# Patient Record
Sex: Female | Born: 1980 | Race: White | Hispanic: No | Marital: Married | Smoking: Never smoker
Health system: Southern US, Community
[De-identification: ages and names within clinical notes are randomized; demographics above are authoritative.]

## PROBLEM LIST (undated history)

## (undated) DIAGNOSIS — O24419 Gestational diabetes mellitus in pregnancy, unspecified control: Secondary | ICD-10-CM

## (undated) DIAGNOSIS — R87629 Unspecified abnormal cytological findings in specimens from vagina: Secondary | ICD-10-CM

## (undated) DIAGNOSIS — Z789 Other specified health status: Secondary | ICD-10-CM

## (undated) HISTORY — DX: Unspecified abnormal cytological findings in specimens from vagina: R87.629

## (undated) HISTORY — PX: HERNIA REPAIR: SHX51

## (undated) HISTORY — DX: Gestational diabetes mellitus in pregnancy, unspecified control: O24.419

## (undated) HISTORY — PX: MYRINGOTOMY: SUR874

## (undated) HISTORY — PX: CERVICAL CONE BIOPSY: SUR198

---

## 2015-04-02 ENCOUNTER — Other Ambulatory Visit (HOSPITAL_COMMUNITY): Payer: Self-pay | Admitting: Obstetrics & Gynecology

## 2015-04-02 DIAGNOSIS — N979 Female infertility, unspecified: Secondary | ICD-10-CM

## 2015-04-09 ENCOUNTER — Ambulatory Visit (HOSPITAL_COMMUNITY)
Admission: RE | Admit: 2015-04-09 | Discharge: 2015-04-09 | Disposition: A | Discharge status: Home / Self Care | Payer: Managed Care, Other (non HMO) | Source: Ambulatory Visit | Attending: Obstetrics & Gynecology | Admitting: Obstetrics & Gynecology

## 2015-04-09 DIAGNOSIS — N979 Female infertility, unspecified: Secondary | ICD-10-CM

## 2015-04-09 MED ORDER — IOHEXOL 300 MG/ML  SOLN
30.0000 mL | Freq: Once | INTRAMUSCULAR | Status: AC | PRN
Start: 2015-04-09 — End: 2015-04-09
  Administered 2015-04-09: 30 mL

## 2016-01-21 NOTE — L&D Delivery Note (Signed)
Delivery Note  First Stage: Labor onset: 1200 Augmentation : none Analgesia /Anesthesia intrapartum: epidural in second stage SROM at 0600  Second Stage: Complete dilation at 2315 Onset of pushing at 0330 FHR second stage category 2 with variables  Delivery of a viable female at [id number] by CNM in ROA position no nuchal cord Noted double tight cord wrap around ankle Cord double clamped after cessation of pulsation, cut by FOB Cord blood sample collected   Third Stage: [facility]acent[facility] delivered [place] [facility] intact with 3 VC @ 0559 Placenta disposition: disposal Uterine tone firm / bleeding small  2nd degree perineal laceration identified  Anesthesia for repair: epidural Repair 3-0 vicryl rapide deep muscle repair / 4-0 vicryl subcuticular Est. Blood Loss (mL): 200  Complications: none  Mom to postpartum.  Baby to Couplet care / Skin to Skin.  Newborn: Birth Weight: 6-14 Apgar Scores: 9-9 Feeding planned: breast  Marlinda MikeBAILEY, TANYA CNM, MSN, [facility] 04/06/2016, 6:25 AM

## 2016-03-17 LAB — OB RESULTS CONSOLE GBS: GBS: NEGATIVE

## 2016-04-05 ENCOUNTER — Inpatient Hospital Stay (HOSPITAL_COMMUNITY)
Admission: AD | Admit: 2016-04-05 | Discharge: 2016-04-07 | DRG: 775 | Disposition: A | Discharge status: Home / Self Care | Payer: BLUE CROSS/BLUE SHIELD | Source: Ambulatory Visit

## 2016-04-05 ENCOUNTER — Encounter (HOSPITAL_COMMUNITY): Payer: Self-pay | Admitting: *Deleted

## 2016-04-05 DIAGNOSIS — Z3A39 39 weeks gestation of pregnancy: Secondary | ICD-10-CM

## 2016-04-05 DIAGNOSIS — Z3490 Encounter for supervision of normal pregnancy, unspecified, unspecified trimester: Secondary | ICD-10-CM

## 2016-04-05 DIAGNOSIS — O4202 Full-term premature rupture of membranes, onset of labor within 24 hours of rupture: Secondary | ICD-10-CM | POA: Diagnosis present

## 2016-04-05 HISTORY — DX: Other specified health status: Z78.9

## 2016-04-05 LAB — OB RESULTS CONSOLE HEPATITIS B SURFACE ANTIGEN: Hepatitis B Surface Ag: NEGATIVE

## 2016-04-05 LAB — CBC
HCT: 38.7 % (ref 36.0–46.0)
HEMOGLOBIN: 12.9 g/dL (ref 12.0–15.0)
MCH: 29.5 pg (ref 26.0–34.0)
MCHC: 33.3 g/dL (ref 30.0–36.0)
MCV: 88.4 fL (ref 78.0–100.0)
Platelets: 193 10*3/uL (ref 150–400)
RBC: 4.38 MIL/uL (ref 3.87–5.11)
RDW: 14.1 % (ref 11.5–15.5)
WBC: 12.6 10*3/uL — AB (ref 4.0–10.5)

## 2016-04-05 LAB — OB RESULTS CONSOLE HIV ANTIBODY (ROUTINE TESTING): HIV: NONREACTIVE

## 2016-04-05 LAB — OB RESULTS CONSOLE ABO/RH: RH Type: POSITIVE

## 2016-04-05 LAB — OB RESULTS CONSOLE GC/CHLAMYDIA
CHLAMYDIA, DNA PROBE: NEGATIVE
GC PROBE AMP, GENITAL: NEGATIVE

## 2016-04-05 LAB — TYPE AND SCREEN
ABO/RH(D): O POS
Antibody Screen: NEGATIVE

## 2016-04-05 LAB — ABO/RH: ABO/RH(D): O POS

## 2016-04-05 LAB — OB RESULTS CONSOLE RUBELLA ANTIBODY, IGM: RUBELLA: IMMUNE

## 2016-04-05 LAB — OB RESULTS CONSOLE RPR: RPR: NONREACTIVE

## 2016-04-05 LAB — OB RESULTS CONSOLE ANTIBODY SCREEN: ANTIBODY SCREEN: NEGATIVE

## 2016-04-05 MED ORDER — ONDANSETRON HCL 4 MG/2ML IJ SOLN: 4.0000 mg | Freq: Four times a day (QID) | INTRAMUSCULAR | Status: DC | PRN

## 2016-04-05 MED ORDER — LIDOCAINE HCL (PF) 1 % IJ SOLN
30.0000 mL | INTRAMUSCULAR | Status: DC | PRN
  Administered 2016-04-06: 30 mL via SUBCUTANEOUS
  Filled 2016-04-05: qty 30

## 2016-04-05 MED ORDER — OXYTOCIN BOLUS FROM INFUSION
500.0000 mL | Freq: Once | INTRAVENOUS | Status: AC
  Administered 2016-04-06: 500 mL via INTRAVENOUS

## 2016-04-05 MED ORDER — LACTATED RINGERS IV SOLN: 500.0000 mL | INTRAVENOUS | Status: DC | PRN

## 2016-04-05 MED ORDER — OXYTOCIN 40 UNITS IN LACTATED RINGERS INFUSION - SIMPLE MED
2.5000 [IU]/h | INTRAVENOUS | Status: DC
  Filled 2016-04-05: qty 1000

## 2016-04-05 MED ORDER — ACETAMINOPHEN 325 MG PO TABS: 650.0000 mg | ORAL_TABLET | ORAL | Status: DC | PRN

## 2016-04-05 MED ORDER — SOD CITRATE-CITRIC ACID 500-334 MG/5ML PO SOLN: 30.0000 mL | ORAL | Status: DC | PRN

## 2016-04-05 MED ORDER — OXYTOCIN 10 UNIT/ML IJ SOLN: 10.0000 [IU] | Freq: Once | INTRAMUSCULAR | Status: DC

## 2016-04-05 NOTE — Progress Notes (Signed)
Requested to special patient for labor & birth  discussed options - SROM at 0600 without onset of active labor yet expectant management - cytotec oral dose - pitocin augmentation  reports ctx every 5 minutes  In past hour - getting little stronger  desires to wait a little longer before augmenting labor - will walk ad lib  re-evaluate in 2-3 hours   Marlinda Mikeanya Bailey CNM [facility]

## 2016-04-05 NOTE — MAU Note (Signed)
Pt woke up leaking fluid around 0600, went to BR, panti liner was soaked.  Soaked another pad after that, also lost mucus plug.  Clear/cloudy fluid, some streaks of blood noted.  Contractions every 5-6 minutes since 0800.

## 2016-04-05 NOTE — Anesthesia Pain Management Evaluation Note (Signed)
  CRNA Pain Management Visit Note  Patient: Claire Wright, 36 y.o., female  "Hello I am a [facility] of the anesthesia [facility]. We have an anesthesia team available at all times to provide care throughout the hospital, including epidural management and anesthesia for C-section. I don't know your plan for the delivery whether it a natural birth, water birth, IV sedation, nitrous supplementation, doula or epidural, but we want to meet your pain goals."   1.Was your pain managed to your expectations on prior hospitalizations?   Yes   2.What is your expectation for pain management during this hospitalization?     Water tub  3.How can we help you reach that goal? waterbirth  Record the patient's initial score and the patient's pain goal.   Pain: 1  Pain Goal: 10 [facility] wants you to be able to say your pain was always managed very well.  Rica RecordsICKELTON,Claire Wright 04/05/2016

## 2016-04-05 NOTE — Progress Notes (Signed)
S:  Painful ctx with some pressure        Labored in tub x 1 hour - out for BRP and cervix check        O:  VS: Blood pressure 132/83, pulse 83, temperature 98.5 F (36.9 C), temperature source Oral, resp. rate 18, height 5\' 2"  (1.575 m), weight 71.2 kg (157 lb).        FHR : baseline 145 doppler         Toco: contractions every 2-4 minutes / strong        Cervix : 7cm / 90% / vtx +1        Membranes: small show        Water temp 100.2 after water refreshed  A: active labor      P: expectant management with continuous labor support   Marlinda MikeBAILEY, [place] Gamero CNM, MSN, [facility] 04/05/2016, 8:46 PM

## 2016-04-05 NOTE — H&P (Signed)
OB ADMISSION/ HISTORY & PHYSICAL:  Admission Date: 04/05/2016  8:57 AM  Admit Diagnosis: Term pregnancy with SROM in early labor   Claire Wright is a [age] y.o. female presenting for ruptured membranes at 0600 and onset of ctx at 0630. Contractions are mild, reports clear fluid with intermittent gushing, + FM, no VB. Plans waterbirth / low intervention.   Prenatal History: G2P1001, VB 15 yrs ago EDC : 04/11/2016 [facility] [red[facility]  Prenatal course uncomplicated.  Prenatal Labs: ABO, Rh:   O pos  Antibody:  neg Rubella:   immune RPR:   NR HBsAg:   Neg HIV:   Neg GBS: Negative (02/26 0000)  3 hr Glucola : 90 / 151 / 153 / 144 Quad screen negative   Medical / Surgical History :  Past medical history:  Past Medical History:  Diagnosis Date  . Medical history non-contributory      Past surgical history:  Past Surgical History:  Procedure Laterality Date  . CERVICAL CONE BIOPSY       Family History: No family history on file.   Social History:  reports that she has never smoked. She has never used smokeless tobacco. She reports that she does not drink alcohol or use drugs.   Allergies: Erythromycin - emesis    Current Medications at time of admission:  PNV, Pepcid, Fioricet, Zyrtec,    Review of Systems: ROS As noted above  Physical Exam: Vitals:   04/05/16 0918 04/05/16 1004  BP: 124/81 122/86  Pulse: 92 92  Resp: 18   Temp: 97.9 F (36.6 C)     General: AAO x 3, NAD Heart:RRR Lungs:CTAB Abdomen:gravid, NT, S=D Extremities:no edema Genitalia / VE: SSE + vaginal pooling with valsalva, clear AF, cvx patulous, deferred digital exam FHR:130, mod var, + accels, no decels TOCO: irregular and mild  Labs:    CBC, T&S, RPR pending   Assessment:  [age] y.o. G2P1001 at 9335w1d  1. Labor: early, SROM clear AF 2. Fetal Wellbeing: Category 1  3. Pain Control: plans hydrotherapy / waterbirth 4. GBS:  neg   Plan:  1. Admit to BS 2. Routine L&D orders 3. Expectant management 4. Wiliam Ke. Bailey, CNM for active labor management   Consultant: Dr. Bartolo Darterousins    Claire Wright, CNM, MSN 04/05/2016, 9:54 AM

## 2016-04-05 NOTE — Progress Notes (Signed)
S:  ctx painful - breathing with ctx        laboring in shower and on labor ball  O:  VS: Blood pressure 132/83, pulse 83, temperature 98.5 F (36.9 C), temperature source Oral, resp. rate 18, height 5\' 2"  (1.575 m), weight 71.2 kg (157 lb).        FHR : baseline 145 / variability moderate / accelerations + / no decelerations with intermittent EFM        Toco: contractions every 3-5 minutes / moderate         cervix : 3 / 95% / vtx 0 station  / loosened cervical scarring with exam during ctx        membranes: clear fluid  A: latent labor     FHR category 1 intermittent EFM  P: expectant management with family labor support        activity ad lib - water immersion in active labor  Marlinda MikeBAILEY, Shyanne Mcclary CNM, MSN, [facility] 04/05/2016, 1700pm

## 2016-04-06 ENCOUNTER — Inpatient Hospital Stay (HOSPITAL_COMMUNITY): Payer: BLUE CROSS/BLUE SHIELD | Admitting: Anesthesiology

## 2016-04-06 ENCOUNTER — Encounter (HOSPITAL_COMMUNITY): Payer: Self-pay

## 2016-04-06 MED ORDER — TERBUTALINE SULFATE 1 MG/ML IJ SOLN
0.2500 mg | Freq: Once | INTRAMUSCULAR | Status: DC | PRN
  Filled 2016-04-06: qty 1

## 2016-04-06 MED ORDER — IBUPROFEN 800 MG PO TABS
800.0000 mg | ORAL_TABLET | Freq: Three times a day (TID) | ORAL | Status: DC
  Administered 2016-04-07 (×2): 800 mg via ORAL
  Filled 2016-04-06 (×3): qty 1

## 2016-04-06 MED ORDER — OXYCODONE HCL 5 MG PO TABS: 5.0000 mg | ORAL_TABLET | ORAL | Status: DC | PRN

## 2016-04-06 MED ORDER — SIMETHICONE 80 MG PO CHEW: 80.0000 mg | CHEWABLE_TABLET | ORAL | Status: DC | PRN

## 2016-04-06 MED ORDER — LACTATED RINGERS IV SOLN: 500.0000 mL | Freq: Once | INTRAVENOUS | Status: DC

## 2016-04-06 MED ORDER — OXYCODONE HCL 5 MG PO TABS: 10.0000 mg | ORAL_TABLET | ORAL | Status: DC | PRN

## 2016-04-06 MED ORDER — LACTATED RINGERS IV SOLN
500.0000 mL | Freq: Once | INTRAVENOUS | Status: AC
  Administered 2016-04-06: 500 mL via INTRAVENOUS

## 2016-04-06 MED ORDER — DIBUCAINE 1 % RE OINT: 1.0000 "application " | TOPICAL_OINTMENT | RECTAL | Status: DC | PRN

## 2016-04-06 MED ORDER — FENTANYL 2.5 MCG/ML BUPIVACAINE 1/10 % EPIDURAL INFUSION (WH - ANES): 14.0000 mL/h | INTRAMUSCULAR | Status: DC | PRN

## 2016-04-06 MED ORDER — DIPHENHYDRAMINE HCL 50 MG/ML IJ SOLN: 12.5000 mg | INTRAMUSCULAR | Status: DC | PRN

## 2016-04-06 MED ORDER — COCONUT OIL OIL
1.0000 "application " | TOPICAL_OIL | Status: DC | PRN
  Administered 2016-04-07: 1 via TOPICAL
  Filled 2016-04-06: qty 120

## 2016-04-06 MED ORDER — LIDOCAINE HCL (PF) 1 % IJ SOLN
INTRAMUSCULAR | Status: DC | PRN
  Administered 2016-04-06: 13 mL via EPIDURAL

## 2016-04-06 MED ORDER — PHENYLEPHRINE 40 MCG/ML (10ML) SYRINGE FOR IV PUSH (FOR BLOOD PRESSURE SUPPORT): 80.0000 ug | PREFILLED_SYRINGE | INTRAVENOUS | Status: DC | PRN

## 2016-04-06 MED ORDER — MISOPROSTOL 200 MCG PO TABS
800.0000 ug | ORAL_TABLET | Freq: Once | ORAL | Status: AC
  Administered 2016-04-06: 800 ug via RECTAL

## 2016-04-06 MED ORDER — EPHEDRINE 5 MG/ML INJ
10.0000 mg | INTRAVENOUS | Status: DC | PRN
  Filled 2016-04-06: qty 2

## 2016-04-06 MED ORDER — EPHEDRINE 5 MG/ML INJ: 10.0000 mg | INTRAVENOUS | Status: DC | PRN

## 2016-04-06 MED ORDER — FENTANYL 2.5 MCG/ML BUPIVACAINE 1/10 % EPIDURAL INFUSION (WH - ANES)
14.0000 mL/h | INTRAMUSCULAR | Status: DC | PRN
  Administered 2016-04-06 (×2): 14 mL/h via EPIDURAL
  Filled 2016-04-06 (×2): qty 100

## 2016-04-06 MED ORDER — WITCH HAZEL-GLYCERIN EX PADS: 1.0000 "application " | MEDICATED_PAD | CUTANEOUS | Status: DC | PRN

## 2016-04-06 MED ORDER — OXYTOCIN 40 UNITS IN LACTATED RINGERS INFUSION - SIMPLE MED
1.0000 m[IU]/min | INTRAVENOUS | Status: DC
  Administered 2016-04-06: 2 m[IU]/min via INTRAVENOUS

## 2016-04-06 MED ORDER — BENZOCAINE-MENTHOL 20-0.5 % EX AERO
1.0000 "application " | INHALATION_SPRAY | CUTANEOUS | Status: DC | PRN
  Administered 2016-04-06: 1 via TOPICAL
  Filled 2016-04-06: qty 56

## 2016-04-06 MED ORDER — PHENYLEPHRINE 40 MCG/ML (10ML) SYRINGE FOR IV PUSH (FOR BLOOD PRESSURE SUPPORT)
80.0000 ug | PREFILLED_SYRINGE | INTRAVENOUS | Status: DC | PRN
  Filled 2016-04-06: qty 5
  Filled 2016-04-06: qty 10

## 2016-04-06 MED ORDER — PHENYLEPHRINE 40 MCG/ML (10ML) SYRINGE FOR IV PUSH (FOR BLOOD PRESSURE SUPPORT)
80.0000 ug | PREFILLED_SYRINGE | INTRAVENOUS | Status: DC | PRN
  Filled 2016-04-06: qty 10
  Filled 2016-04-06: qty 5

## 2016-04-06 MED ORDER — MISOPROSTOL 200 MCG PO TABS
ORAL_TABLET | ORAL | Status: AC
  Filled 2016-04-06: qty 4

## 2016-04-06 MED ORDER — ACETAMINOPHEN 325 MG PO TABS: 650.0000 mg | ORAL_TABLET | ORAL | Status: DC | PRN

## 2016-04-06 MED ORDER — SENNOSIDES-DOCUSATE SODIUM 8.6-50 MG PO TABS
2.0000 | ORAL_TABLET | ORAL | Status: DC
  Administered 2016-04-07: 2 via ORAL
  Filled 2016-04-06: qty 2

## 2016-04-06 NOTE — Lactation Note (Signed)
This note was copied from a baby's chart. Lactation Consultation Note Initial visit at 15 hours of age.  Baby has had two 15 minutes feedings with 2 voids and 5 stools.  Last LATCH score of "8". Mom reports it feels like baby is biting and nipples are a little sore.  Mom reports having difficulty on right breast, baby fights and screams, she acts mad when she gets on.   Mom has 6 months of exclusive breast feeding experience with now 34 year old.   Mom reports recent feeding and baby awake and alert in crib.   LC offered to assist with feeding as baby may now want the other breast.  Mom moved to chair due to bed being uncomfortable to her.   LC placed baby STS with mom, baby arches and stiffens at the breast, but was able to get latched with good sucking and stimulation needed for about 10 minutes.  Baby has wide gape with flanged lips.  LC removed baby and noted compression stripe to left nipple.  Lc observed baby cry with mouth open and short thin lingual frenulum noted to tip of tongue. LC allowed baby to suck gloved finger.  Baby noted to have increase tone, tongue thrusting and biting.  Baby does not suck with tongue cupping finger.  Baby noted to extend tongue past lower gumline with tip on tongue remaining behind lower gumline and mid tongue thrusting/humping.   LC discussed findings with mom and offered to fit mom with NS and set up DEBP, Mom agreeable. LC fit mom with #20 NS, baby screamed and pushed away, no real latch achieved with NS, baby seems oral defensive at this time. LC offered to assist with spoon feeding, so mom hand expressed and LC spoon fed baby after baby calmed.  Baby tolerated 4mls of EBM well. Mom is not eager to use NS and plans to pump with DEBP and hand express prior to NS.  LC cautioned mom with latching baby without NS as baby could cause additional nipple trauma.   LC reported to nursery Rn, Sharl MaMarty to report to baby's Dr. In am rounds.  Mom encouraged to ask Dr. To  evaluate tongue function in am. LC feels this baby may benefit with tongue revision to aid in breastfeeding. LC encouraged mom to hand express and spoon feed during the night as needed if baby is not latching with NS.   LC set up DEBP with instructions to post pump for 15 minute and hand express to offer EBM to baby.  Mom to call for assist as needed. LC called report to bedside RN, Vernona RiegerLaura    Patient Name: Claire Wright NWGNF'AToday's Date: 04/06/2016 Reason for consult: Initial assessment   Maternal Data Has patient been taught Hand Expression?: Yes Does the patient have breastfeeding experience prior to this delivery?: Yes  Feeding Feeding Type: Breast Fed Length of feed: 15 min  LATCH Score/Interventions Latch: Repeated attempts needed to sustain latch, nipple held in mouth throughout feeding, stimulation needed to elicit sucking reflex. Intervention(s): Adjust position;Assist with latch;Breast massage;Breast compression  Audible Swallowing: A few with stimulation  Type of Nipple: Everted at rest and after stimulation  Comfort (Breast/Nipple): Soft / non-tender     Hold (Positioning): Assistance needed to correctly position infant at breast and maintain latch. Intervention(s): Breastfeeding basics reviewed;Support Pillows;Position options;Skin to skin  LATCH Score: 7  Lactation Tools Discussed/Used     Consult Status Consult Status: Follow-up Date: 04/07/16 Follow-up type: In-patient  Jannifer Rodney 04/06/2016, 9:36 PM

## 2016-04-06 NOTE — Plan of Care (Signed)
Problem: Activity: Goal: Ability to tolerate increased activity will improve Outcome: Progressing Numbness in left leg is resolving and patient is able to ambulate independently with stand by assist.

## 2016-04-06 NOTE — Progress Notes (Signed)
S:  ctx too painful -"cant do it anymore" - severe back pain & unable to push due to pain       out of tub 2230 for trial of nitrous for pain relief (ineffective pain relief)        wants epidural now (previous labor with epidural)  O:  VS: Blood pressure (!) 124/50, pulse (!) 112, temperature 97.9 F (36.6 C), temperature source Oral, resp. rate 14, height 5\' 2"  (1.575 m), weight 71.2 kg (157 lb).        FHR : baseline 130 / variability moderate / accelerations + / occasional variable deceleration        Toco: contractions every 2-4 minutes / strong x 90 seconds        Cervix : complete at 2315 vtx +2 with small caput        EFW 8 pounds (previous SVD 5 1/2 pounds)   A: active labor      FHR category 2  P: needs pain relief to achieve productive second stage      IV start and epidural placement              If full pain control achieved - will reposition lateral on peanut for rest with passive descent               If minimal pain control - will proceed with active coached pushing   Marlinda MikeBAILEY, TANYA CNM, MSN, [facility] 04/06/2016, 12:32 AM

## 2016-04-06 NOTE — Anesthesia Postprocedure Evaluation (Signed)
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  Procedure(s) Performed: * No procedures listed *  Patient location during evaluation: Mother Baby Anesthesia Type: Epidural Level of consciousness: awake and alert and oriented Pain management: satisfactory to patient Vital Signs Assessment: post-procedure vital signs reviewed and stable Respiratory status: spontaneous breathing and nonlabored ventilation Cardiovascular status: stable Postop Assessment: no headache, no backache, no signs of nausea or vomiting, adequate PO intake and patient able to bend at knees (patient up walking) Anesthetic complications: no        Last Vitals:  Vitals:   04/06/16 0824 04/06/16 0930  BP: 129/73 119/66  Pulse: 98 95  Resp: 18 18  Temp: 37.1 C 36.9 C    Last Pain:  Vitals:   04/06/16 0930  TempSrc: Axillary  PainSc: 0-No pain   Pain Goal: Patients Stated Pain Goal: 0 (04/05/16 0920)               Madison HickmanGREGORY,Helia Haese

## 2016-04-06 NOTE — Anesthesia Procedure Notes (Signed)
Epidural Patient location during procedure: OB Start time: 04/06/2016 1:03 AM End time: 04/06/2016 1:26 AM  Staffing Anesthesiologist: Anitra LauthMILLER, Chezney Huether RAY Performed: anesthesiologist   Preanesthetic Checklist Completed: patient identified, site marked, surgical consent, pre-op evaluation, timeout performed, IV checked, risks and benefits discussed and monitors and equipment checked  Epidural Patient position: sitting Prep: DuraPrep Patient monitoring: heart rate, cardiac monitor, continuous pulse ox and blood pressure Approach: midline Location: L3-L4 Injection technique: LOR air  Needle:  Needle type: Tuohy  Needle gauge: 17 G Needle length: 9 cm Needle insertion depth: 6 cm Catheter type: closed end flexible Catheter size: 20 Guage Catheter at skin depth: 9 cm Test dose: negative  Assessment Events: blood not aspirated, injection not painful, no injection resistance, negative IV test and no paresthesia  Additional Notes Reason for block:procedure for pain

## 2016-04-06 NOTE — Anesthesia Preprocedure Evaluation (Signed)
Anesthesia Evaluation  Patient identified by MRN, date of birth, ID band Patient awake    Reviewed: Allergy & Precautions, NPO status , Patient's Chart, lab work & pertinent test results  Airway Mallampati: II  TM Distance: >3 FB Neck ROM: Full    Dental no notable dental hx.    Pulmonary neg pulmonary ROS,    Pulmonary exam normal breath sounds clear to auscultation       Cardiovascular negative cardio ROS Normal cardiovascular exam Rhythm:Regular Rate:Normal     Neuro/Psych negative neurological ROS  negative psych ROS   GI/Hepatic negative GI ROS, Neg liver ROS,   Endo/Other  negative endocrine ROS  Renal/GU negative Renal ROS  negative genitourinary   Musculoskeletal negative musculoskeletal ROS (+)   Abdominal   Peds negative pediatric ROS (+)  Hematology negative hematology ROS (+)   Anesthesia Other Findings   Reproductive/Obstetrics negative OB ROS (+) Pregnancy                             Anesthesia Physical Anesthesia Plan  ASA: II  Anesthesia Plan: Epidural   Post-op Pain Management:    Induction:   Airway Management Planned:   Additional Equipment:   Intra-op Plan:   Post-operative Plan:   Informed Consent:   Plan Discussed with:   Anesthesia Plan Comments:         Anesthesia Quick Evaluation  

## 2016-04-07 LAB — RPR: RPR: REACTIVE — AB

## 2016-04-07 LAB — CBC
HCT: 34.3 % — ABNORMAL LOW (ref 36.0–46.0)
Hemoglobin: 11.5 g/dL — ABNORMAL LOW (ref 12.0–15.0)
MCH: 29.9 pg (ref 26.0–34.0)
MCHC: 33.5 g/dL (ref 30.0–36.0)
MCV: 89.1 fL (ref 78.0–100.0)
Platelets: 162 10*3/uL (ref 150–400)
RBC: 3.85 MIL/uL — ABNORMAL LOW (ref 3.87–5.11)
RDW: 14.6 % (ref 11.5–15.5)
WBC: 14.7 10*3/uL — ABNORMAL HIGH (ref 4.0–10.5)

## 2016-04-07 LAB — RPR, QUANT+TP ABS (REFLEX)
Rapid Plasma Reagin, Quant: 1:1 {titer} — ABNORMAL HIGH
T Pallidum Abs: NEGATIVE

## 2016-04-07 MED ORDER — IBUPROFEN 800 MG PO TABS: 800.0000 mg | ORAL_TABLET | Freq: Three times a day (TID) | ORAL | 0 refills | Status: DC

## 2016-04-07 NOTE — Discharge Summary (Signed)
Obstetric Discharge Summary Reason for Admission: onset of labor Prenatal Procedures: none Intrapartum Procedures: spontaneous vaginal delivery Postpartum Procedures: none Complications-Operative and Postpartum: 2nd degree perineal laceration Hemoglobin  Date Value Ref Range Status  04/07/2016 11.5 (L) 12.0 - 15.0 g/dL Final   HCT  Date Value Ref Range Status  04/07/2016 34.3 (L) 36.0 - 46.0 % Final    Physical Exam:  General: alert, cooperative and no distress Lochia: appropriate Uterine Fundus: firm Incision: healing well DVT Evaluation: No evidence of DVT seen on physical exam.  Discharge Diagnoses: Term Pregnancy-delivered  Discharge Information: Date: 04/07/2016 Activity: pelvic rest Diet: routine Medications: PNV and Ibuprofen Condition: stable Instructions: refer to practice specific booklet Discharge to: home Follow-up Information    Marlinda MikeBAILEY, Rhyan Radler, CNM. Schedule an appointment as soon as possible for a visit in 2 week(s).   Specialty:  Obstetrics and Gynecology Why:  lactation visit Contact information: [address] [place] [id number] [phone]           Newborn Data: Live born female  Birth Weight: 6 lb 14 oz (3118 g) APGAR: 9, 9  Home with mother.  Marlinda MikeBAILEY, Jaydien Panepinto 04/07/2016, 5:21 PM

## 2016-04-07 NOTE — Lactation Note (Signed)
This note was copied from a baby's chart. Lactation Consultation Note  Mother in bathroom hand expressing into colostrum container upon entering.  Expressed approx 5 ml. Baby sleeping.  Left LC phone number and suggest mother call when baby cues for assistance w/ latching.   Patient Name: Claire Wallace CullensCharity Nappier GNFAO'ZToday's Date: 04/07/2016 Reason for consult: Follow-up assessment   Maternal Data    Feeding Feeding Type: Breast Milk  LATCH Score/Interventions                      Lactation Tools Discussed/Used     Consult Status Consult Status: Follow-up Date: 04/07/16 Follow-up type: In-patient    Dahlia ByesBerkelhammer, Ruth [facility] Center[facility] 04/07/2016, 12:11 PM

## 2016-04-07 NOTE — Lactation Note (Signed)
This note was copied from a baby's chart. Lactation Consultation Note  Baby cueing and family called for latch assistance. Mother has been able to hand express at least 5 ml with each session prior to feeding.  Praised her for her efforts. Parents had lots of questions about lingual frenulum.  Referred them to Pediatrician and gave them resource sheets with specialist names and websites. Positioned baby in side lying.  Baby latched after a few tries.  Sucks and some swallows observed.  Baby latched for 25 min on L side. Baby re-latched in football hold an additional 10 min. Suggest mother keep hand expressing often and post pumping 4-5 times a day for 10-15 min and give baby back volume until weight stabilizes. Provided mother with shells to wear and coconut oil. Mom encouraged to feed baby 8-12 times/24 hours and with feeding cues. Wake for feeds and place STS if needed. Reviewed engorgement care and monitoring voids/stools.     Patient Name: Claire Wright TKZSW'FToday's Date: 04/07/2016 Reason for consult: Follow-up assessment   Maternal Data    Feeding Feeding Type: Breast Fed Length of feed: 25 min  LATCH Score/Interventions Latch: Repeated attempts needed to sustain latch, nipple held in mouth throughout feeding, stimulation needed to elicit sucking reflex. Intervention(s): Adjust position;Breast massage;Breast compression  Audible Swallowing: A few with stimulation  Type of Nipple: Everted at rest and after stimulation  Comfort (Breast/Nipple): Filling, red/small blisters or bruises, mild/mod discomfort  Problem noted: Mild/Moderate discomfort Interventions (Mild/moderate discomfort): Hand expression;Breast shields (coconut oil)  Hold (Positioning): No assistance needed to correctly position infant at breast.  LATCH Score: 7  Lactation Tools Discussed/Used     Consult Status Consult Status: Follow-up Date: 04/08/16 Follow-up type:  In-patient    Dahlia ByesBerkelhammer, Claire Wright 04/07/2016, 3:38 PM

## 2016-04-07 NOTE — Progress Notes (Signed)
PPD 1 SVD  S:  Reports feeling well             Tolerating po/ No nausea or vomiting             Bleeding is light             Pain controlled with motrin             Up ad lib / ambulatory / voiding QS  Newborn (baby girl B[place]aklyn) breast feeding  O:               VS: BP 104/65 (BP Location: Left Arm)   Pulse 78   Temp 98 F (36.7 C) (Oral)   Resp 18   Ht 5\' 2"  (1.575 m)   Wt 71.2 kg (157 lb)   SpO2 98%   Breastfeeding? Unknown   BMI 28.72 kg/m    LABS:              Recent Labs  04/05/16 1153 04/07/16 0513  WBC 12.6* 14.7*  HGB 12.9 11.5*  PLT 193 162               Blood type: --/--/O POS (03/17 1153)  Rubella: Immune (03/17 0000)                          Physical Exam:             Alert and oriented X3  Abdomen: soft, non-tender, non-distended              Fundus: firm, non-tender, U-1  Perineum: mild edema  Lochia: light  Extremities: no edema, no calf pain or tenderness   A: PPD # 1 SVD with 2nd degree repair   Doing well - stable status  P: Routine post partum orders  DC tonight - will remain to room-in  Marlinda MikeBAILEY, Sylver Vantassell CNM, MSN, [facility] 04/07/2016, 1710

## 2016-04-08 ENCOUNTER — Ambulatory Visit: Payer: Self-pay

## 2016-04-08 NOTE — Lactation Note (Signed)
This note was copied from a baby's chart. Lactation Consultation Note Noted 7% weight loss in 42 hrs of life. Baby has multiple feedings. Mom has given colostrum 3 ml and 6 mls via syring as supplement after BF. Out put has been 11 voids, 11 stools, 1 documented emesis. LC feels the large output is cause for 7% weight loss.  LC will f/u. Patient Name: Girl Wallace CullensCharity Ursua ZOXWR'UToday's Date: 04/08/2016 Reason for consult: Follow-up assessment;Infant weight loss   Maternal Data    Feeding Feeding Type: Breast Milk Length of feed: 15 min  LATCH Score/Interventions Latch: Grasps breast easily, tongue down, lips flanged, rhythmical sucking. Intervention(s): Assist with latch  Audible Swallowing: A few with stimulation Intervention(s): Skin to skin  Type of Nipple: Everted at rest and after stimulation  Comfort (Breast/Nipple): Filling, red/small blisters or bruises, mild/mod discomfort  Problem noted: Mild/Moderate discomfort;Cracked, bleeding, blisters, bruises Interventions  (Cracked/bleeding/bruising/blister): Other (comment) (Coconut oil) Interventions (Mild/moderate discomfort): Hand massage;Hand expression;Breast shields  Hold (Positioning): Assistance needed to correctly position infant at breast and maintain latch.  LATCH Score: 7  Lactation Tools Discussed/Used     Consult Status Consult Status: Follow-up Date: 04/08/16 Follow-up type: In-patient    Icelyn Navarrete, Diamond NickelLAURA G 04/08/2016, 12:08 AM

## 2016-04-08 NOTE — Lactation Note (Addendum)
This note was copied from a baby's chart. Lactation Consultation Note  Patient Name: Claire Wright ZOXWR'UToday's Date: 04/08/2016 Reason for consult: Follow-up assessment Mom states baby cluster fed through the night.  She is also pumping and hand expressing every 3 hours and obtaining 5-6210mls.  Assisted with feeding in both football hold and cross cradle.  Parents shown how to compress breast tissue for easier and deeper latch.  Baby latches easily and well.  Reviewed waking techniques and breast massage.  Baby nursed for 20 minutes with some swallows noted.  Baby does have a short lingual frenulum but it doesn't seem to interfere with feedings at this time.  Discussed with mom if there is a problem with weight gain or sore nipples she may want to explore options with pediatrician. Baby is having yellow stools. Outpatient lactation services and support reviewed and encourged.  Maternal Data    Feeding Feeding Type: Breast Fed Length of feed: 20 min  LATCH Score/Interventions Latch: Grasps breast easily, tongue down, lips flanged, rhythmical sucking. Intervention(s): Adjust position;Assist with latch;Breast massage;Breast compression  Audible Swallowing: A few with stimulation Intervention(s): Skin to skin;Hand expression;Alternate breast massage  Type of Nipple: Everted at rest and after stimulation  Comfort (Breast/Nipple): Soft / non-tender     Hold (Positioning): Assistance needed to correctly position infant at breast and maintain latch. Intervention(s): Breastfeeding basics reviewed;Support Pillows;Position options;Skin to skin  LATCH Score: 8  Lactation Tools Discussed/Used     Consult Status Consult Status: Complete    [name], [name] 04/08/2016, 9:52 AM

## 2018-03-20 IMAGING — RF DG HYSTEROGRAM
5 series · 5 of 5 positions shown · IV contrast (omnipaque)
Comparison: None.

FLUOROSCOPY TIME:  Fluoroscopy Time:  36 seconds

Number of Acquired Images:  5

CLINICAL DATA: Secondary infertility

EXAM:
HYSTEROSALPINGOGRAM
TECHNIQUE: Following cleansing of the cervix and vagina with Betadine solution,
a hysterosalpingogram was performed using a 5-French
hysterosalpingogram catheter and Omnipaque 300 contrast. The patient
tolerated the examination without difficulty.

[Series 1: run · 1 of 1 slices shown (1 of 5)]
[im 1/1]
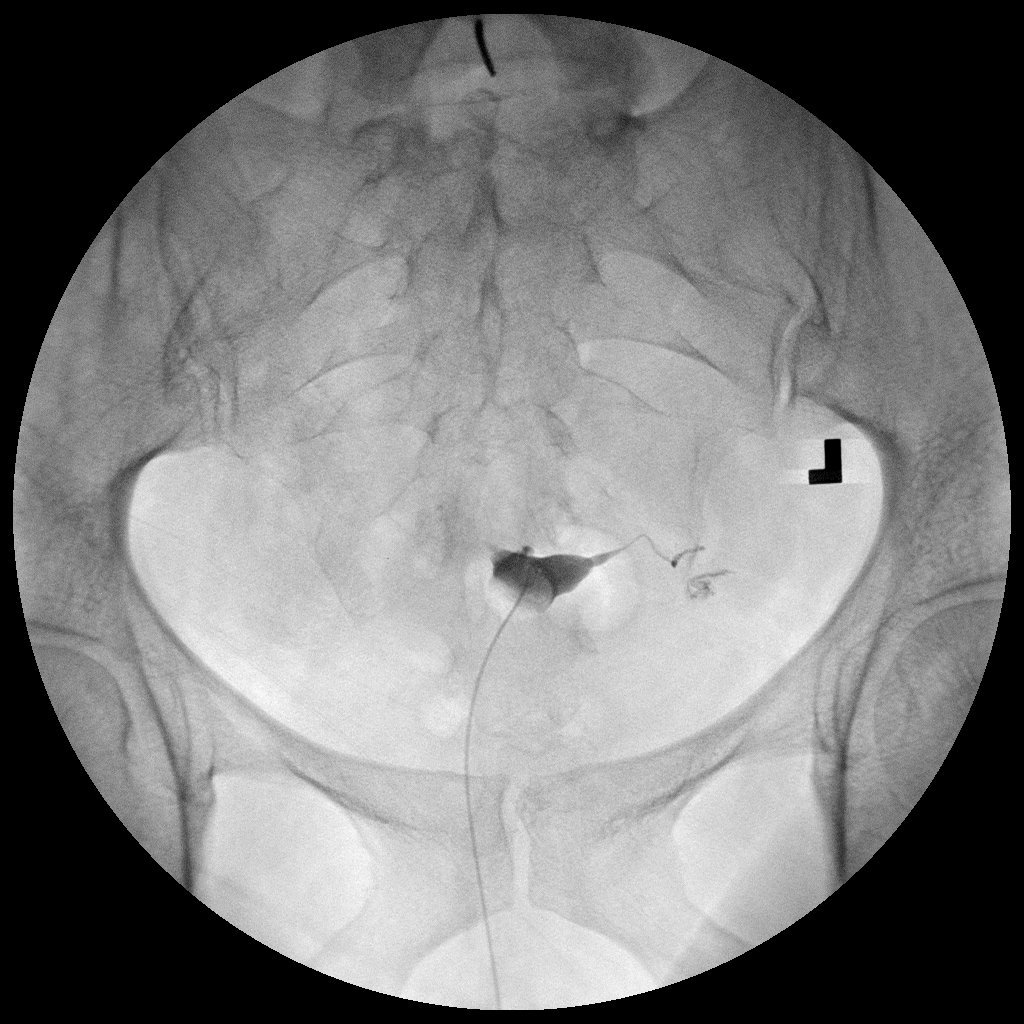

[Series 2: run · 1 of 1 slices shown (2 of 5)]
[im 1/1]
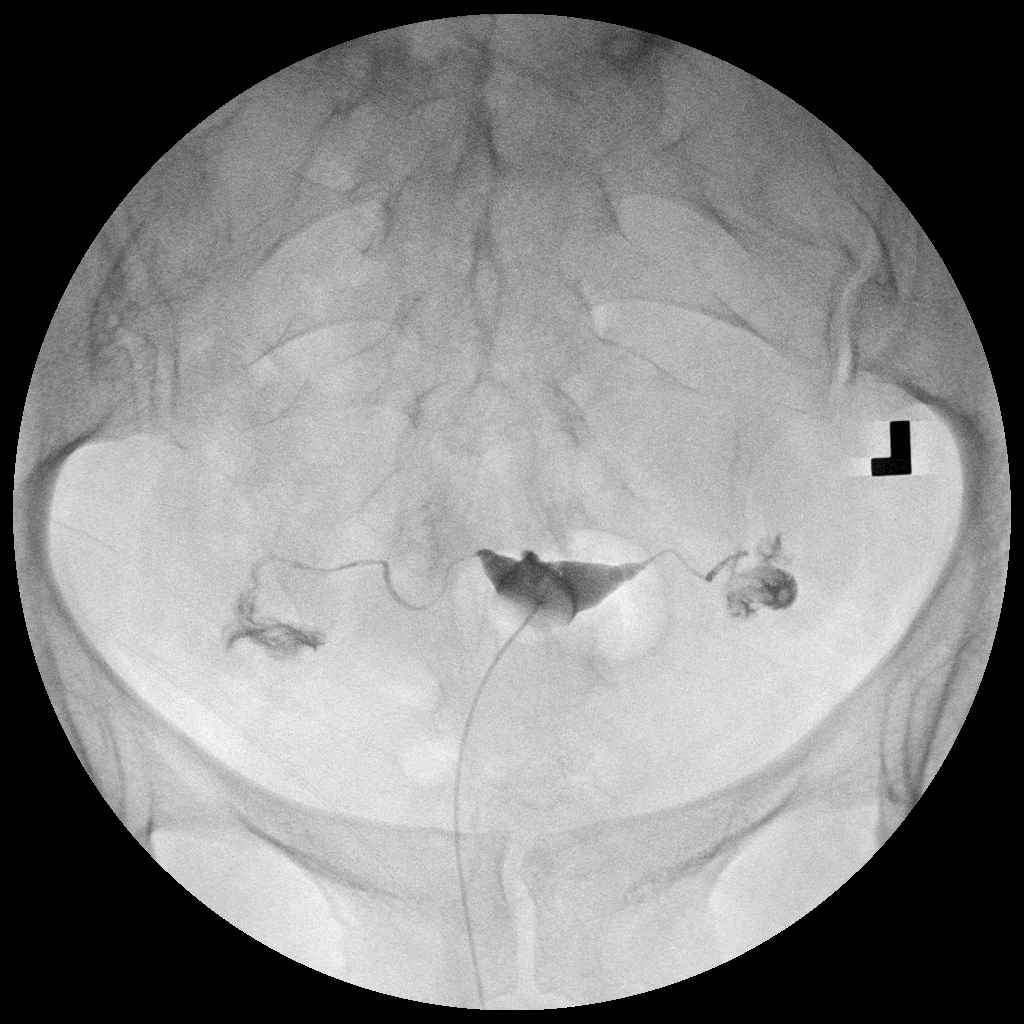

[Series 3: run · 1 of 1 slices shown (3 of 5)]
[im 1/1]
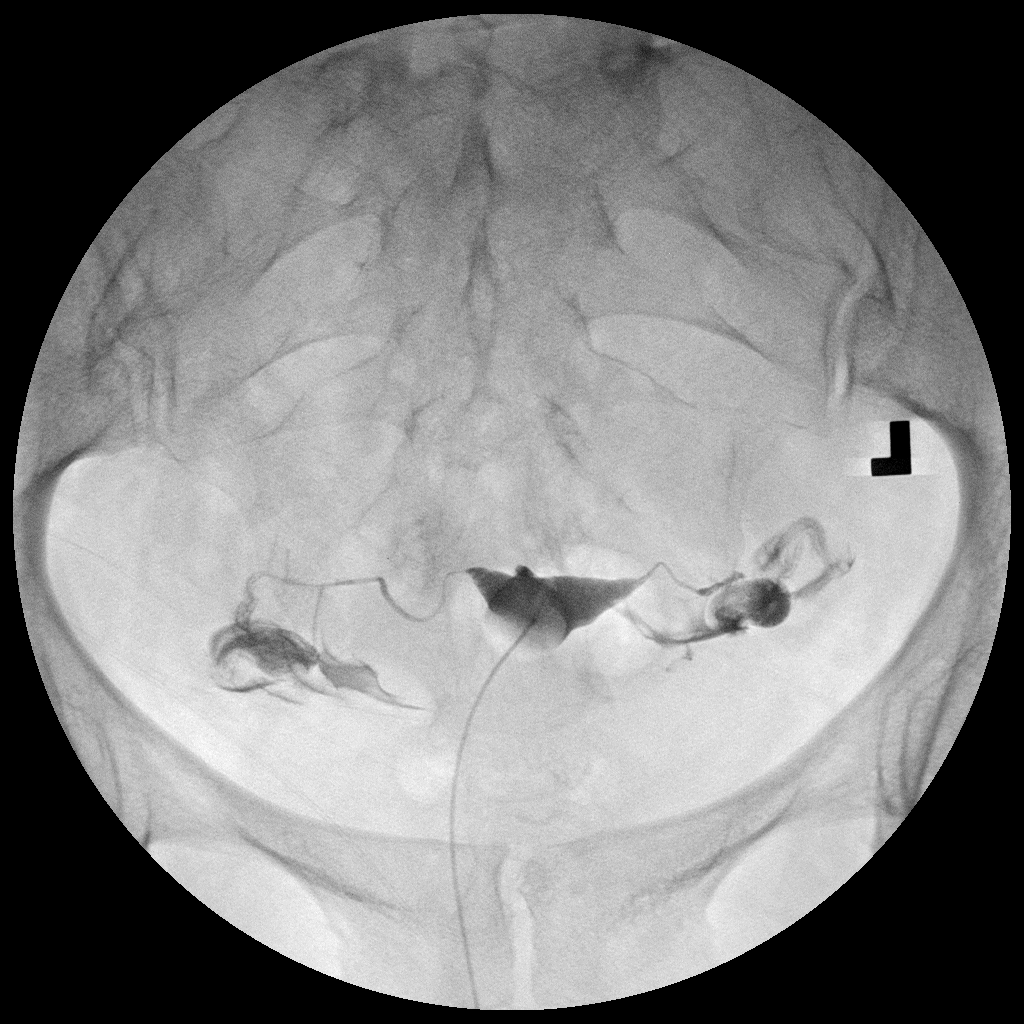

[Series 4: run · 1 of 1 slices shown (4 of 5)]
[im 1/1]
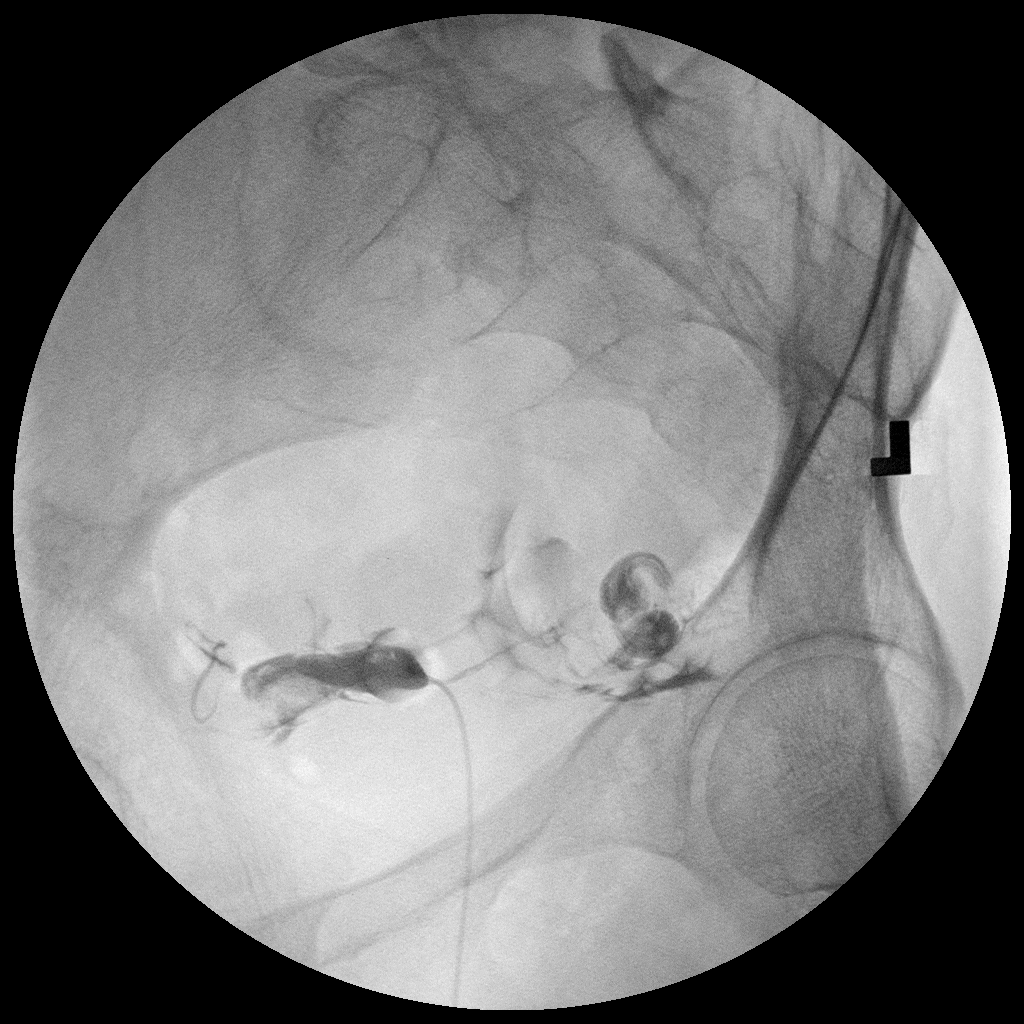

[Series 5: run · 1 of 1 slices shown (5 of 5)]
[im 1/1]
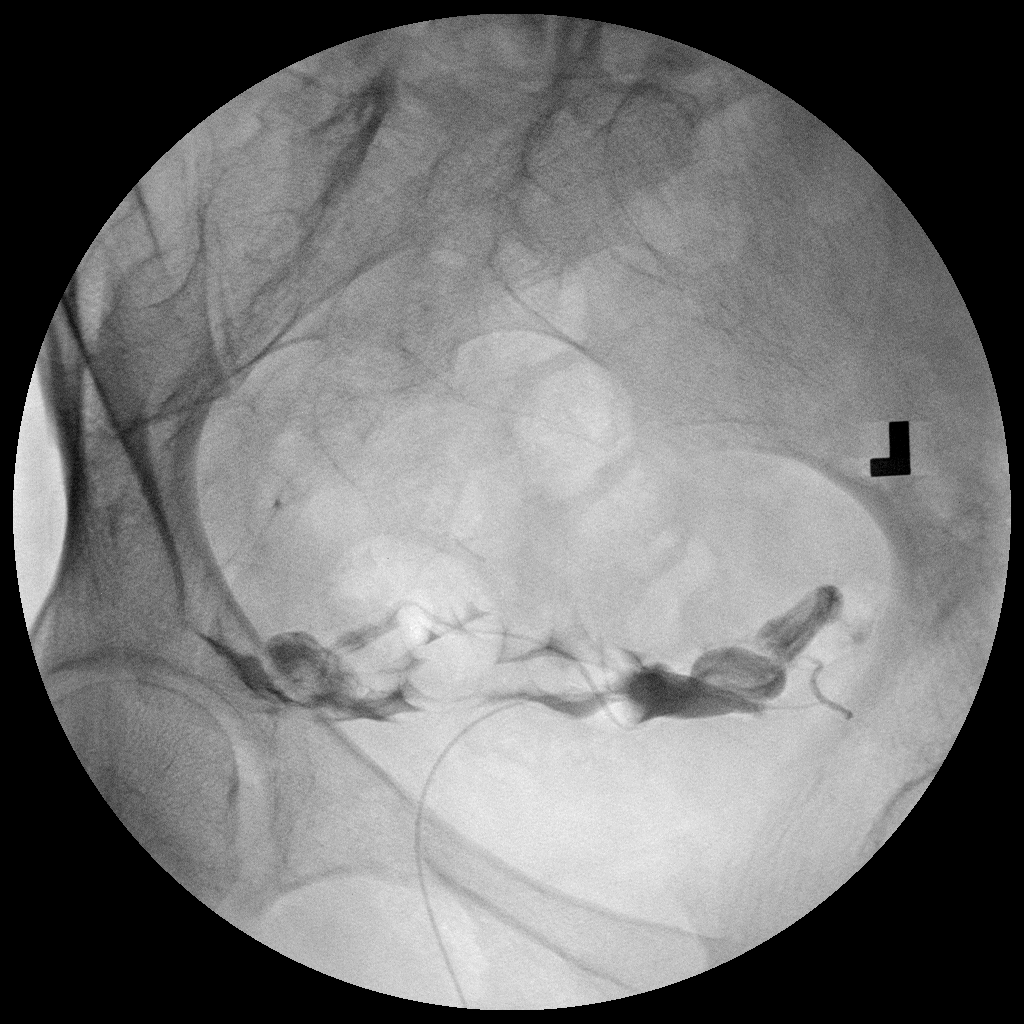

[5 of 5 positions shown; findings below may reference images not displayed]

FINDINGS: Normal early filling of the uterus.

Opacification of the bilateral fallopian tubes.

Satisfactory peritoneal spill bilaterally.
IMPRESSION: Normal HSG.

## 2020-01-21 NOTE — L&D Delivery Note (Signed)
Operative Delivery Note At 12:04 AM a viable and healthy female was delivered via Vaginal, Vacuum Investment banker, operational).  Presentation: vertex; Position: Left,, Occiput,, Anterior; Station: +3.  I was called to patient room by primary OB provider, Renae Fickle, CNM to assess for operative delivery for category II tracing. Patient had been induced for Velamentous cord insertion at 39.5 and progressed well to complete on Pitocin. FHR baseline prior to pushing had been 120-130s with reassuring category I tracing throughout. With pushing, baseline dropped to 100-110 but maintained minimal to moderate variability and decelerations with pushing quickly returned to baseline in between contractions. Excellent maternal effort with pushing. Pelvis felt to be adequate and no concerns for fetal size. Patient consented for vacuum assisted delivery.   Verbal consent: obtained from patient.  Risks and benefits discussed in detail.  Risks include, but are not limited to the risks of anesthesia, bleeding, infection, damage to maternal tissues, fetal cephalhematoma.  There is also the risk of inability to effect vaginal delivery of the head, or shoulder dystocia that cannot be resolved by established maneuvers, leading to the need for emergency cesarean section.  Neonatal team called to delivery room.  The Kiwi vacuum extractor was applied at the flexion point approximately 2 cm posterior to the anterior fontanelle. Bladder had already been drained by Foley catheter removed just prior to pushing. The vacuum was brought to a pressure level of 500 mmHg and steady pulls performed with maternal efforts during the contraction. Delivery of the fetal head was obtained with 1 set of pulls, 0 pop offs. Vacuum was removed. Loose nuchal cord x 2 was reduced at the perineum. The head restituted to ROT and delivery of the shoulders and remaining body performed without difficulties after gentle downward traction.   APGAR: 8, 9; weight pending .   Placenta  status: spontaneous, intact. Velamentous cord insertion   Cord:  with the following complications: .  Cord pH: n/a  Anesthesia:  Epidural Instruments: Kiwi Vacuum  Episiotomy: None Lacerations:  2nd degree perineal Suture Repair: performed by Arlan Organ, CNM Est. Blood Loss (mL):  average at time of delivery  Mom to postpartum.  Baby to Couplet care / Skin to Skin.  Rosibel Giacobbe A Darlynn Ricco 09/20/2020, 12:16 AM

## 2020-03-13 LAB — OB RESULTS CONSOLE GC/CHLAMYDIA
Chlamydia: NEGATIVE
Gonorrhea: NEGATIVE

## 2020-03-13 LAB — OB RESULTS CONSOLE HEPATITIS B SURFACE ANTIGEN: Hepatitis B Surface Ag: NEGATIVE

## 2020-03-13 LAB — OB RESULTS CONSOLE ABO/RH: RH Type: POSITIVE

## 2020-03-13 LAB — OB RESULTS CONSOLE ANTIBODY SCREEN: Antibody Screen: NEGATIVE

## 2020-03-13 LAB — OB RESULTS CONSOLE RUBELLA ANTIBODY, IGM: Rubella: IMMUNE

## 2020-03-13 LAB — OB RESULTS CONSOLE HIV ANTIBODY (ROUTINE TESTING): HIV: NONREACTIVE

## 2020-03-13 LAB — OB RESULTS CONSOLE RPR: RPR: NONREACTIVE

## 2020-07-25 ENCOUNTER — Encounter: Payer: BC Managed Care – PPO | Admitting: Registered"

## 2020-07-25 ENCOUNTER — Other Ambulatory Visit: Payer: Self-pay

## 2020-07-25 DIAGNOSIS — O24419 Gestational diabetes mellitus in pregnancy, unspecified control: Secondary | ICD-10-CM | POA: Diagnosis present

## 2020-07-26 ENCOUNTER — Encounter: Payer: Self-pay | Admitting: Registered"

## 2020-07-26 DIAGNOSIS — O24419 Gestational diabetes mellitus in pregnancy, unspecified control: Secondary | ICD-10-CM | POA: Insufficient documentation

## 2020-07-26 NOTE — Progress Notes (Signed)
Patient was seen on 07/25/20 for Gestational Diabetes self-management class at [facility]. The following learning objectives were met by the patient during this course:  States the definition of Gestational Diabetes States why dietary management is important in controlling blood glucose Describes the effects each nutrient has on blood glucose levels Demonstrates ability to create a balanced meal plan Demonstrates carbohydrate counting  States when to check blood glucose levels Demonstrates proper blood glucose monitoring techniques States the effect of stress and exercise on blood glucose levels States the importance of limiting caffeine and abstaining from alcohol and smoking  Blood glucose monitor given: Patient has meter and is checking blood sugar prior to class  Patient states she is struggling to put meals together, does not like much protein and is confused why some meals are better for blood sugar more than others. It may not be clear to patient it is not just about the meal size, but the content of the meal considering macronutrient balance and simple carb vs simple carb.  Patient instructed to monitor glucose levels: FBS: 60 - <95; 1 hour: <140; 2 hour: <120  Patient received handouts: Nutrition Diabetes and Pregnancy, including carb counting list  Patient will be seen for follow-up as needed.

## 2020-08-29 LAB — OB RESULTS CONSOLE GC/CHLAMYDIA: Gonorrhea: POSITIVE

## 2020-09-10 LAB — OB RESULTS CONSOLE GC/CHLAMYDIA: Gonorrhea: NEGATIVE

## 2020-09-12 ENCOUNTER — Encounter (HOSPITAL_COMMUNITY): Payer: Self-pay | Admitting: *Deleted

## 2020-09-12 ENCOUNTER — Telehealth (HOSPITAL_COMMUNITY): Payer: Self-pay | Admitting: *Deleted

## 2020-09-12 NOTE — Telephone Encounter (Signed)
Preadmission screen  

## 2020-09-19 ENCOUNTER — Inpatient Hospital Stay (HOSPITAL_COMMUNITY): Payer: BC Managed Care – PPO | Admitting: Anesthesiology

## 2020-09-19 ENCOUNTER — Other Ambulatory Visit: Payer: Self-pay

## 2020-09-19 ENCOUNTER — Encounter (HOSPITAL_COMMUNITY): Payer: Self-pay | Admitting: Obstetrics and Gynecology

## 2020-09-19 ENCOUNTER — Inpatient Hospital Stay (HOSPITAL_COMMUNITY)
Admission: AD | Admit: 2020-09-19 | Discharge: 2020-09-21 | DRG: 807 | Disposition: A | Discharge status: Home / Self Care | Payer: BC Managed Care – PPO | Attending: Obstetrics and Gynecology | Admitting: Obstetrics and Gynecology

## 2020-09-19 ENCOUNTER — Inpatient Hospital Stay (HOSPITAL_COMMUNITY): Payer: BC Managed Care – PPO

## 2020-09-19 DIAGNOSIS — Z3A39 39 weeks gestation of pregnancy: Secondary | ICD-10-CM | POA: Diagnosis not present

## 2020-09-19 DIAGNOSIS — O43123 Velamentous insertion of umbilical cord, third trimester: Secondary | ICD-10-CM | POA: Diagnosis present

## 2020-09-19 DIAGNOSIS — O24419 Gestational diabetes mellitus in pregnancy, unspecified control: Secondary | ICD-10-CM | POA: Diagnosis present

## 2020-09-19 DIAGNOSIS — Z3490 Encounter for supervision of normal pregnancy, unspecified, unspecified trimester: Secondary | ICD-10-CM | POA: Diagnosis present

## 2020-09-19 DIAGNOSIS — O99824 Streptococcus B carrier state complicating childbirth: Secondary | ICD-10-CM | POA: Diagnosis present

## 2020-09-19 DIAGNOSIS — O24420 Gestational diabetes mellitus in childbirth, diet controlled: Secondary | ICD-10-CM | POA: Diagnosis present

## 2020-09-19 DIAGNOSIS — Z8759 Personal history of other complications of pregnancy, childbirth and the puerperium: Secondary | ICD-10-CM

## 2020-09-19 DIAGNOSIS — Z20822 Contact with and (suspected) exposure to covid-19: Secondary | ICD-10-CM | POA: Diagnosis present

## 2020-09-19 LAB — COMPREHENSIVE METABOLIC PANEL
ALT: 21 U/L (ref 0–44)
AST: 22 U/L (ref 15–41)
Albumin: 3 g/dL — ABNORMAL LOW (ref 3.5–5.0)
Alkaline Phosphatase: 168 U/L — ABNORMAL HIGH (ref 38–126)
Anion gap: 10 (ref 5–15)
BUN: 9 mg/dL (ref 6–20)
CO2: 19 mmol/L — ABNORMAL LOW (ref 22–32)
Calcium: 8.8 mg/dL — ABNORMAL LOW (ref 8.9–10.3)
Chloride: 105 mmol/L (ref 98–111)
Creatinine, Ser: 0.76 mg/dL (ref 0.44–1.00)
GFR, Estimated: >60 mL/min (ref >60)
Glucose, Bld: 83 mg/dL (ref 70–99)
Potassium: 4 mmol/L (ref 3.5–5.1)
Sodium: 134 mmol/L — ABNORMAL LOW (ref 135–145)
Total Bilirubin: 0.7 mg/dL (ref 0.3–1.2)
Total Protein: 6.6 g/dL (ref 6.5–8.1)

## 2020-09-19 LAB — GLUCOSE, CAPILLARY
Glucose-Capillary: 147 mg/dL — ABNORMAL HIGH (ref 70–99)
Glucose-Capillary: 100 mg/dL — ABNORMAL HIGH (ref 70–99)

## 2020-09-19 LAB — CBC
HCT: 38.8 % (ref 36.0–46.0)
Hemoglobin: 12.6 g/dL (ref 12.0–15.0)
MCH: 28.3 pg (ref 26.0–34.0)
MCHC: 32.5 g/dL (ref 30.0–36.0)
MCV: 87.2 fL (ref 80.0–100.0)
Platelets: 157 10*3/uL (ref 150–400)
RBC: 4.45 MIL/uL (ref 3.87–5.11)
RDW: 14.3 % (ref 11.5–15.5)
WBC: 8.9 10*3/uL (ref 4.0–10.5)
nRBC: 0 % (ref 0.0–0.2)

## 2020-09-19 LAB — RESP PANEL BY RT-PCR (FLU A&B, COVID) ARPGX2
Influenza A by PCR: NEGATIVE
Influenza B by PCR: NEGATIVE
SARS Coronavirus 2 by RT PCR: NEGATIVE

## 2020-09-19 LAB — TYPE AND SCREEN
ABO/RH(D): O POS
Antibody Screen: NEGATIVE

## 2020-09-19 MED ORDER — OXYTOCIN-SODIUM CHLORIDE 30-0.9 UT/500ML-% IV SOLN: 2.5000 [IU]/h | INTRAVENOUS | Status: DC

## 2020-09-19 MED ORDER — FAMOTIDINE 20 MG PO TABS: 10.0000 mg | ORAL_TABLET | Freq: Two times a day (BID) | ORAL | Status: DC | PRN

## 2020-09-19 MED ORDER — LACTATED RINGERS IV SOLN: 500.0000 mL | Freq: Once | INTRAVENOUS | Status: DC

## 2020-09-19 MED ORDER — LACTATED RINGERS IV SOLN: 500.0000 mL | INTRAVENOUS | Status: DC | PRN

## 2020-09-19 MED ORDER — LACTATED RINGERS IV SOLN: INTRAVENOUS | Status: DC

## 2020-09-19 MED ORDER — LIDOCAINE HCL (PF) 1 % IJ SOLN
INTRAMUSCULAR | Status: DC | PRN
  Administered 2020-09-19: 5 mL via EPIDURAL
  Administered 2020-09-19: 4 mL via EPIDURAL

## 2020-09-19 MED ORDER — EPHEDRINE 5 MG/ML INJ: 10.0000 mg | INTRAVENOUS | Status: DC | PRN

## 2020-09-19 MED ORDER — OXYTOCIN BOLUS FROM INFUSION: 333.0000 mL | Freq: Once | INTRAVENOUS | Status: DC

## 2020-09-19 MED ORDER — FAMOTIDINE-CA CARB-MAG HYDROX 10-800-165 MG PO CHEW: 1.0000 | CHEWABLE_TABLET | ORAL | Status: DC | PRN

## 2020-09-19 MED ORDER — TERBUTALINE SULFATE 1 MG/ML IJ SOLN: 0.2500 mg | Freq: Once | INTRAMUSCULAR | Status: DC | PRN

## 2020-09-19 MED ORDER — LIDOCAINE HCL (PF) 1 % IJ SOLN: 30.0000 mL | INTRAMUSCULAR | Status: DC | PRN

## 2020-09-19 MED ORDER — DIPHENHYDRAMINE HCL 50 MG/ML IJ SOLN: 12.5000 mg | INTRAMUSCULAR | Status: DC | PRN

## 2020-09-19 MED ORDER — ACETAMINOPHEN 325 MG PO TABS: 650.0000 mg | ORAL_TABLET | ORAL | Status: DC | PRN

## 2020-09-19 MED ORDER — SOD CITRATE-CITRIC ACID 500-334 MG/5ML PO SOLN
30.0000 mL | ORAL | Status: DC | PRN
Start: 2020-09-19 — End: 2020-09-20
  Administered 2020-09-19: 30 mL via ORAL
  Filled 2020-09-19: qty 30

## 2020-09-19 MED ORDER — FENTANYL-BUPIVACAINE-NACL 0.5-0.125-0.9 MG/250ML-% EP SOLN
EPIDURAL | Status: AC
  Filled 2020-09-19: qty 250

## 2020-09-19 MED ORDER — ONDANSETRON HCL 4 MG/2ML IJ SOLN: 4.0000 mg | Freq: Four times a day (QID) | INTRAMUSCULAR | Status: DC | PRN

## 2020-09-19 MED ORDER — MISOPROSTOL 50MCG HALF TABLET
50.0000 ug | ORAL_TABLET | ORAL | Status: AC | PRN
  Administered 2020-09-19: 50 ug via BUCCAL
  Filled 2020-09-19: qty 1

## 2020-09-19 MED ORDER — SODIUM CHLORIDE 0.9% FLUSH: 3.0000 mL | INTRAVENOUS | Status: DC | PRN

## 2020-09-19 MED ORDER — CALCIUM CARBONATE ANTACID 500 MG PO CHEW: 1.0000 | CHEWABLE_TABLET | Freq: Three times a day (TID) | ORAL | Status: DC | PRN

## 2020-09-19 MED ORDER — SODIUM CHLORIDE 0.9 % IV SOLN: 250.0000 mL | INTRAVENOUS | Status: DC | PRN

## 2020-09-19 MED ORDER — SODIUM CHLORIDE 0.9 % IV SOLN
5.0000 10*6.[IU] | Freq: Once | INTRAVENOUS | Status: AC
  Administered 2020-09-19: 5 10*6.[IU] via INTRAVENOUS
  Filled 2020-09-19: qty 5

## 2020-09-19 MED ORDER — OXYTOCIN-SODIUM CHLORIDE 30-0.9 UT/500ML-% IV SOLN
1.0000 m[IU]/min | INTRAVENOUS | Status: DC
Start: 2020-09-19 — End: 2020-09-20
  Administered 2020-09-19: 2 m[IU]/min via INTRAVENOUS
  Filled 2020-09-19: qty 500

## 2020-09-19 MED ORDER — FENTANYL-BUPIVACAINE-NACL 0.5-0.125-0.9 MG/250ML-% EP SOLN
12.0000 mL/h | EPIDURAL | Status: DC | PRN
  Administered 2020-09-19: 12 mL/h via EPIDURAL

## 2020-09-19 MED ORDER — PHENYLEPHRINE 40 MCG/ML (10ML) SYRINGE FOR IV PUSH (FOR BLOOD PRESSURE SUPPORT): 80.0000 ug | PREFILLED_SYRINGE | INTRAVENOUS | Status: DC | PRN

## 2020-09-19 MED ORDER — OXYTOCIN 10 UNIT/ML IJ SOLN: 10.0000 [IU] | Freq: Once | INTRAMUSCULAR | Status: DC

## 2020-09-19 MED ORDER — PENICILLIN G POT IN DEXTROSE 60000 UNIT/ML IV SOLN
3.0000 10*6.[IU] | INTRAVENOUS | Status: DC
  Administered 2020-09-19: 3 10*6.[IU] via INTRAVENOUS
  Filled 2020-09-19 (×3): qty 50

## 2020-09-19 MED ORDER — SODIUM CHLORIDE 0.9% FLUSH: 3.0000 mL | Freq: Two times a day (BID) | INTRAVENOUS | Status: DC

## 2020-09-19 NOTE — Progress Notes (Signed)
S: Doing well, pain now controlled with  Epidural, no pelvic pressure. Family at [facility] and supportive.  Had tried shower for hydrotherapy but not able to relax. Recheck of cervix showed unchanged despite Pitocin augmentation and SROM, tight cervical band persisting. Option for epidural discussed and requested.   O: Vitals:   09/19/20 2032 09/19/20 2035 09/19/20 2040 09/19/20 2045  BP: 135/82 129/75 128/84 124/76  Pulse: 85 82 88 84  Resp:      Temp:      TempSrc:      SpO2:  100% 99% 100%  Weight:      Height:         FHT:  FHR: 130 bpm, variability: moderate,  accelerations:  Present,  decelerations:  Present early UC:   regular, every 2-3 minutes SVE:   Dilation: 4 Effacement (%): 90 Station: -1 Exam by:: [employer] , RN Tight cervical band  A / P: Protracted latent phase on Pitocin Will continue to increase Pitocin and use position changes to encourage progression, will attempt to digitally release cervical band when more thinned out.  Fetal Wellbeing:  Category I Pain Control:  Epidural GBS prophylaxis adequate Anticipated MOD:  NSVD  Neta Mends, CNM, MSN 09/19/2020, 8:55 PM

## 2020-09-19 NOTE — Anesthesia Procedure Notes (Signed)
Epidural Patient location during procedure: OB Start time: 09/19/2020 8:22 PM End time: 09/19/2020 8:26 PM  Staffing Anesthesiologist: Beryle Lathe, MD Performed: anesthesiologist   Preanesthetic Checklist Completed: patient identified, IV checked, risks and benefits discussed, monitors and equipment checked, pre-op evaluation and timeout performed  Epidural Patient position: sitting Prep: DuraPrep Patient monitoring: continuous pulse ox and blood pressure Approach: midline Location: L2-L3 Injection technique: LOR saline  Needle:  Needle type: Tuohy  Needle gauge: 17 G Needle length: 9 cm Needle insertion depth: 6 cm Catheter size: 19 Gauge Catheter at skin depth: 10 cm Test dose: negative and Other (1% lidocaine)  Assessment Events: blood not aspirated  Additional Notes Patient identified. Risks including, but not limited to, bleeding, infection, nerve damage, paralysis, inadequate analgesia, blood pressure changes, nausea, vomiting, allergic reaction, postpartum back pain, itching, and headache were discussed. Patient expressed understanding and wished to proceed. Sterile prep and drape, including hand hygiene, mask, and sterile gloves were used. The patient was positioned and the spine was prepped. The skin was anesthetized with lidocaine. No paraesthesia or other complication noted. The patient did not experience any signs of intravascular injection such as tinnitus or metallic taste in mouth, nor signs of intrathecal spread such as rapid motor block. Please see nursing notes for vital signs. The patient tolerated the procedure well.   Leslye Peer, MDReason for block:procedure for pain

## 2020-09-19 NOTE — Anesthesia Preprocedure Evaluation (Signed)
Anesthesia Evaluation  Patient identified by MRN, date of birth, ID band Patient awake    Reviewed: Allergy & Precautions, NPO status , Patient's Chart, lab work & pertinent test results  History of Anesthesia Complications Negative for: history of anesthetic complications  Airway Mallampati: II   Neck ROM: Full    Dental   Pulmonary neg pulmonary ROS,    Pulmonary exam normal        Cardiovascular negative cardio ROS Normal cardiovascular exam     Neuro/Psych negative neurological ROS  negative psych ROS   GI/Hepatic negative GI ROS, Neg liver ROS,   Endo/Other  diabetes, Well Controlled, Gestational  Renal/GU negative Renal ROS     Musculoskeletal negative musculoskeletal ROS (+)   Abdominal   Peds  Hematology negative hematology ROS (+)  Plt 157k    Anesthesia Other Findings   Reproductive/Obstetrics (+) Pregnancy                             Anesthesia Physical Anesthesia Plan  ASA: 2  Anesthesia Plan: Epidural   Post-op Pain Management:    Induction:   PONV Risk Score and Plan: 2 and Treatment may vary due to age or medical condition  Airway Management Planned: Natural Airway  Additional Equipment: None  Intra-op Plan:   Post-operative Plan:   Informed Consent: I have reviewed the patients History and Physical, chart, labs and discussed the procedure including the risks, benefits and alternatives for the proposed anesthesia with the patient or authorized representative who has indicated his/her understanding and acceptance.       Plan Discussed with: Anesthesiologist  Anesthesia Plan Comments: (Labs reviewed. Platelets acceptable, patient not taking any blood thinning medications. Per RN, FHR tracing reported to be stable enough for sitting procedure. Risks and benefits discussed with patient, including PDPH, backache, epidural hematoma, failed epidural, blood  pressure changes, allergic reaction, and nerve injury. Patient expressed understanding and wished to proceed.)        Anesthesia Quick Evaluation

## 2020-09-19 NOTE — H&P (Signed)
OB ADMISSION/ HISTORY & PHYSICAL:  Admission Date: 09/19/2020 10:52 AM  Admit Diagnosis: INDUCTION    Claire Wright is a 40 y.o. female presenting for scheduled IOL at term. Seen in office yesterday, membrane sweep done. Cvx 2-3/60/-2, posterior, vertex. Denies LOF/VB, occasional mild ctx, + FM. Labor support - spouse [name]. Desires low intervention, waterbirth. Expecting baby boy Claire Wright and undecided about circumcision.   Prenatal History: [id number]   EDC : 09/22/2020, by Other Basis  Prenatal care at [facility] since 1st trimester, primary Sigmon, CNM.   Prenatal course complicated by: GDM A1 well controlled VCI with serial growth AGA Hx cervical conization followed by serial CL Oral progesterone support in 1st trimester GBS positive  Prenatal Labs: ABO, Rh: O (02/22 0000)  Antibody: Negative (02/22 0000) Rubella: Immune (02/22 0000)  RPR: Nonreactive (02/22 0000)  HBsAg: Negative (02/22 0000)  HIV: Non-reactive (02/22 0000)  GBS:   positive 3 hr Glucola : abnormal Genetic Screening: declined Ultrasound: normal XY anatomy, AGA, VCI, anterior placenta Growth sono 36 wks AGA 53%, AFI 13, vertex Pelvis proven 6'14"  Vaccines: TDaP          UTD                    COVID-19 UTD  Maternal Diabetes: Yes:  Diabetes Type:  Diet controlled Genetic Screening: Declined Maternal Ultrasounds/Referrals: Normal Fetal Ultrasounds or other Referrals:  None Maternal Substance Abuse:  No Significant Maternal Medications:  None Significant Maternal Lab Results:  Group B Strep positive Other Comments:  None  Medical / Surgical History :  Past medical history:  Past Medical History:  Diagnosis Date   Gestational diabetes    Medical history non-contributory    Vaginal Pap smear, abnormal      Past surgical history:  Past Surgical History:  Procedure Laterality Date   CERVICAL CONE BIOPSY     HERNIA REPAIR     as infant   MYRINGOTOMY       Family  History: No family history on file.   Social History:  reports that she has never smoked. She has never used smokeless tobacco. She reports that she does not drink alcohol and does not use drugs.  Allergies: Erythromycin   Current Medications at time of admission:  Medications Prior to Admission  Medication Sig Dispense Refill Last Dose   cetirizine (ZYRTEC) 10 MG tablet Take 10 mg by mouth at bedtime.      famotidine-calcium carbonate-magnesium hydroxide (PEPCID COMPLETE) 10-800-165 MG chewable tablet Chew 1 tablet by mouth as needed (up to 4 tablets a day).      Prenatal Vit-Fe Fumarate-FA (PRENATAL MULTIVITAMIN) TABS tablet Take 1 tablet by mouth daily at 12 noon.        Review of Systems: ROS As noted above Physical Exam: Vital signs and nursing notes reviewed.  General: AAO x 3, NAD Heart: RRR Lungs:CTAB Abdomen: Gravid, NT, Leopold's vertex Extremities: no edema Genitalia / VE:   2-3/60/-2 in office  FHR: 120 BPM, mod variability, + accels, no decels TOCO: Ctx irregular  Labs:   Pending T&S, CBC, RPR  No results for input(s): WBC, HGB, HCT, PLT in the last 72 hours.   Assessment:  40 y.o. [id number] at [redacted]w[redacted]d, GDM A1  1. Induction 2. FHR category 1 3. GBS pos 4. Desires unmedicated/hydrotherapy, open to epidural PRN 5. Breastfeeding 6. Placenta disposal per patient request  Plan: 1. Admit to BS 2. Routine L&D orders, GBS prophylaxis with  active labor 3. Analgesia/anesthesia/hydrotherapy PRN  4. Cervical ripening with Cytotec 50 mcg buccal x 1 dose then Pitocin PRN 5. Anticipate NSVB   Dr Billy Coast notified of admission / plan of care   Neta Mends CNM, MSN 09/19/2020, 10:56 AM

## 2020-09-19 NOTE — Progress Notes (Signed)
                                                                                                                                                                                                                                                                                                                       Claire Wright is a 40 y.o. [id number] at [redacted]w[redacted]d by ultrasound admitted for  IOL at term, GDM A1, VCI.  Subjective: Reports ctx more intense since SROM, clear fluid. Tolerating ctx well, no N/V. Claire Wright and Claire Wright (cousin) at [facility] and supportive.   Objective: Vitals:   09/19/20 1203 09/19/20 1308 09/19/20 1407 09/19/20 1504  BP: 140/83 131/82 138/81 122/78  Pulse: 77 80 76 79  Resp: 20  18 16   Temp: (!) 97.5 F (36.4 C)     TempSrc: Oral     Weight: 75.5 kg     Height: 5\' 2"  (1.575 m)        FHT:  FHR: 130 bpm, variability: moderate,  accelerations:  Present,  decelerations:  Absent UC:   regular, every 4 minutes SVE:   Dilation: 3.5 Effacement (%): 90 Station: -1 Exam by:: , CNM Vertex Clear fluid  Labs:   Recent Labs    09/19/20 1147  WBC 8.9  HGB 12.6  HCT 38.8  PLT 157    Assessment / Plan: Claire Wright 40 y.o. [redacted]w[redacted]d GDM A1, VCI CBG stable, will continue to monitor Labor:  S/P Cytotec buccal 50 mcg x 1 dose, SROM at 1530 Pitocin augmentation now, will DC with onset of physiologic labor for water immersion.  Preeclampsia:  no signs or symptoms of toxicity Fetal Wellbeing:  Category I Pain Control:  Labor support without medications and hydrotherapy PRN I/D:   GBS positive, PCN prophylaxis started Anticipated MOD:  NSVD  38, CNM, MSN 09/19/2020, 4:17 PM

## 2020-09-20 DIAGNOSIS — Z8759 Personal history of other complications of pregnancy, childbirth and the puerperium: Secondary | ICD-10-CM

## 2020-09-20 LAB — CBC
HCT: 36.3 % (ref 36.0–46.0)
Hemoglobin: 12 g/dL (ref 12.0–15.0)
MCH: 28.6 pg (ref 26.0–34.0)
MCHC: 33.1 g/dL (ref 30.0–36.0)
MCV: 86.6 fL (ref 80.0–100.0)
Platelets: 156 10*3/uL (ref 150–400)
RBC: 4.19 MIL/uL (ref 3.87–5.11)
RDW: 14.3 % (ref 11.5–15.5)
WBC: 20.6 10*3/uL — ABNORMAL HIGH (ref 4.0–10.5)
nRBC: 0 % (ref 0.0–0.2)

## 2020-09-20 LAB — RPR: RPR Ser Ql: NONREACTIVE

## 2020-09-20 MED ORDER — ONDANSETRON HCL 4 MG PO TABS: 4.0000 mg | ORAL_TABLET | ORAL | Status: DC | PRN

## 2020-09-20 MED ORDER — DIPHENHYDRAMINE HCL 25 MG PO CAPS: 25.0000 mg | ORAL_CAPSULE | Freq: Four times a day (QID) | ORAL | Status: DC | PRN

## 2020-09-20 MED ORDER — SENNOSIDES-DOCUSATE SODIUM 8.6-50 MG PO TABS
2.0000 | ORAL_TABLET | ORAL | Status: DC
  Administered 2020-09-20 – 2020-09-21 (×2): 2 via ORAL
  Filled 2020-09-20 (×3): qty 2

## 2020-09-20 MED ORDER — DIBUCAINE (PERIANAL) 1 % EX OINT: 1.0000 "application " | TOPICAL_OINTMENT | CUTANEOUS | Status: DC | PRN

## 2020-09-20 MED ORDER — ACETAMINOPHEN 500 MG PO TABS
1000.0000 mg | ORAL_TABLET | Freq: Four times a day (QID) | ORAL | Status: DC
  Administered 2020-09-20 – 2020-09-21 (×5): 1000 mg via ORAL
  Filled 2020-09-20 (×6): qty 2

## 2020-09-20 MED ORDER — COCONUT OIL OIL
1.0000 "application " | TOPICAL_OIL | Status: DC | PRN
  Administered 2020-09-20: 1 via TOPICAL

## 2020-09-20 MED ORDER — ONDANSETRON HCL 4 MG/2ML IJ SOLN: 4.0000 mg | INTRAMUSCULAR | Status: DC | PRN

## 2020-09-20 MED ORDER — BENZOCAINE-MENTHOL 20-0.5 % EX AERO
1.0000 "application " | INHALATION_SPRAY | CUTANEOUS | Status: DC | PRN
  Administered 2020-09-20: 1 via TOPICAL
  Filled 2020-09-20: qty 56

## 2020-09-20 MED ORDER — BISACODYL 10 MG RE SUPP: 10.0000 mg | Freq: Every day | RECTAL | Status: DC | PRN

## 2020-09-20 MED ORDER — WITCH HAZEL-GLYCERIN EX PADS: 1.0000 "application " | MEDICATED_PAD | CUTANEOUS | Status: DC | PRN

## 2020-09-20 MED ORDER — PRENATAL MULTIVITAMIN CH
1.0000 | ORAL_TABLET | Freq: Every day | ORAL | Status: DC
  Administered 2020-09-20 – 2020-09-21 (×2): 1 via ORAL
  Filled 2020-09-20 (×2): qty 1

## 2020-09-20 MED ORDER — SIMETHICONE 80 MG PO CHEW: 80.0000 mg | CHEWABLE_TABLET | ORAL | Status: DC | PRN

## 2020-09-20 MED ORDER — ZOLPIDEM TARTRATE 5 MG PO TABS: 5.0000 mg | ORAL_TABLET | Freq: Every evening | ORAL | Status: DC | PRN

## 2020-09-20 MED ORDER — TETANUS-DIPHTH-ACELL PERTUSSIS 5-2.5-18.5 LF-MCG/0.5 IM SUSY: 0.5000 mL | PREFILLED_SYRINGE | Freq: Once | INTRAMUSCULAR | Status: DC

## 2020-09-20 MED ORDER — IBUPROFEN 600 MG PO TABS
600.0000 mg | ORAL_TABLET | Freq: Four times a day (QID) | ORAL | Status: DC
  Administered 2020-09-20 – 2020-09-21 (×6): 600 mg via ORAL
  Filled 2020-09-20 (×6): qty 1

## 2020-09-20 MED ORDER — FLEET ENEMA 7-19 GM/118ML RE ENEM: 1.0000 | ENEMA | Freq: Every day | RECTAL | Status: DC | PRN

## 2020-09-20 NOTE — Anesthesia Postprocedure Evaluation (Signed)
Anesthesia Post Note  Patient: Production assistant, radio  Procedure(s) Performed: AN AD HOC LABOR EPIDURAL     Patient location during evaluation: Mother Baby Anesthesia Type: Epidural Level of consciousness: awake, oriented and awake and alert Pain management: pain level controlled Vital Signs Assessment: post-procedure vital signs reviewed and stable Respiratory status: spontaneous breathing, respiratory function stable and nonlabored ventilation Cardiovascular status: stable Postop Assessment: no headache, adequate PO intake, able to ambulate, patient able to bend at knees, no backache and no apparent nausea or vomiting Anesthetic complications: no   No notable events documented.  Last Vitals:  Vitals:   09/20/20 0215 09/20/20 0327  BP: 131/88 139/80  Pulse: 80 76  Resp: 18 17  Temp: 37.3 C 37 C  SpO2: 99% 98%    Last Pain:  Vitals:   09/20/20 0327  TempSrc: Oral  PainSc:    Pain Goal:                   Claire Wright

## 2020-09-20 NOTE — Lactation Note (Signed)
This note was copied from a baby's chart. Lactation Consultation Note  Patient Name: Boy Deshondra Worst [id number] Date: 09/20/2020 Reason for consult: Initial assessment Age:38 hours P3 Mother reports that she breastfeed her first two children for two years. Mother reports that infant is breastfeeding well.  Mother reports that she would like to be fit with the proper size flange. Mother had request that the staff nurse set up a DEBP at the bedside.  I fit mother with the #24 flange and ran the first cycle. #24 was a good fit .  Mother reports that she had been using  the #24 at home to try to induce labor.   Observed mother independently latch infant. Advised mother to flange infant lips for wider gape. Infant was observed for 10 mins with suck swallow pattern.  Mother advised to continue to cue base feed infant and feed infant 8-12 or more times in 24 hours.  Discussed cluster feeding.   Maternal Data Has patient been taught Hand Expression?: Yes Does the patient have breastfeeding experience prior to this delivery?: Yes How long did the patient breastfeed?: 1 yr with first 2 children  Feeding Mother's Current Feeding Choice: Breast Milk  LATCH Score Latch: Grasps breast easily, tongue down, lips flanged, rhythmical sucking.  Audible Swallowing: A few with stimulation  Type of Nipple: Everted at rest and after stimulation  Comfort (Breast/Nipple): Soft / non-tender  Hold (Positioning): No assistance needed to correctly position infant at breast.  LATCH Score: 9   Lactation Tools Discussed/Used Tools: Pump;Flanges Flange Size: 24 Breast pump type: Double-Electric Breast Pump Pump Education: Setup, frequency, and cleaning;Milk Storage Reason for Pumping: mothers request to fit flange size  Interventions Interventions: Breast feeding basics reviewed;Assisted with latch;Skin to skin;Hand express;Pre-pump if needed;Breast compression;Support pillows;Adjust position;Position  options  Discharge Pump: Personal (Medela double electric)  Consult Status Consult Status: Follow-up Date: 09/20/20 Follow-up type: In-patient    Stevan Born [facility] 09/20/2020, 2:44 PM

## 2020-09-21 MED ORDER — IBUPROFEN 600 MG PO TABS: 600.0000 mg | ORAL_TABLET | Freq: Four times a day (QID) | ORAL | 0 refills | Status: AC

## 2020-09-21 MED ORDER — ACETAMINOPHEN 500 MG PO TABS: 1000.0000 mg | ORAL_TABLET | Freq: Four times a day (QID) | ORAL | 0 refills | Status: AC

## 2020-09-21 NOTE — Discharge Summary (Signed)
OB Discharge Summary  Patient Name: Claire Wright DOB: 29-Sep-1980 MRN: [id number]  Date of admission: 09/19/2020 Delivering provider: Clance Boll A   Admitting diagnosis: Encounter for planned induction of labor [Z34.90] Intrauterine pregnancy: [redacted]w[redacted]d     Secondary diagnosis: Patient Active Problem List   Diagnosis Date Noted   Status post vacuum-assisted vaginal delivery 09/20/2020   Perineal laceration, second degree 09/20/2020   Gestational diabetes mellitus (GDM), antepartum 07/26/2020   Postpartum care following VAVD 9/1 04/06/2016   Encounter for planned induction of labor 04/05/2016    Date of discharge: 09/21/2020   Discharge diagnosis: Principal Problem:   Postpartum care following VAVD 9/1 Active Problems:   Encounter for planned induction of labor   Gestational diabetes mellitus (GDM), antepartum   Status post vacuum-assisted vaginal delivery   Perineal laceration, second degree                                                            Post partum procedures: None  Augmentation: Pitocin and Cytotec Pain control: Epidural  Laceration:2nd degree;Perineal  Episiotomy:None  Complications: None  Hospital course:  Induction of Labor With Vaginal Delivery   40 y.o. yo [id number] at [redacted]w[redacted]d was admitted to the hospital 09/19/2020 for induction of labor.  Indication for induction: A1 DM.  Patient had an uncomplicated labor course as follows: Membrane Rupture Time/Date: 3:08 PM ,09/19/2020   Delivery Method:Vaginal, Vacuum (Extractor)  Episiotomy: None  Lacerations:  2nd degree;Perineal  Details of delivery can be found in separate delivery note.  Patient had a routine postpartum course. Patient is discharged home 09/21/20.  Newborn Data: Birth date:09/20/2020  Birth time:12:04 AM  Gender:Female  Living status:Living  Apgars:8 ,9  Weight:3155 g   Physical exam  Vitals:   09/20/20 0327 09/20/20 1459 09/20/20 2005 09/21/20 0530  BP: 139/80 (!) 131/95 130/82 118/75   Pulse: 76 74 78 79  Resp: 17 19 18 16   Temp: 98.6 F (37 C) 98.2 F (36.8 C) 98.1 F (36.7 C) 98.2 F (36.8 C)  TempSrc: Oral Oral Oral Oral  SpO2: 98% 100% 100% 99%  Weight:      Height:       General: alert and cooperative Lochia: appropriate Uterine Fundus: firm Incision: N/A Perineum: repair intact, no edema DVT Evaluation: No evidence of DVT seen on physical exam. No significant calf/ankle edema. Labs: Lab Results  Component Value Date   WBC 20.6 (H) 09/20/2020   HGB 12.0 09/20/2020   HCT 36.3 09/20/2020   MCV 86.6 09/20/2020   PLT 156 09/20/2020   CMP Latest Ref Rng & Units 09/19/2020  Glucose 70 - 99 mg/dL 83  BUN 6 - 20 mg/dL 9  Creatinine 09/21/2020 - 4.25 mg/dL 9.56  Sodium 3.87 - 564 mmol/L 134(L)  Potassium 3.5 - 5.1 mmol/L 4.0  Chloride 98 - 111 mmol/L 105  CO2 22 - 32 mmol/L 19(L)  Calcium 8.9 - 10.3 mg/dL 332)  Total Protein 6.5 - 8.1 g/dL 6.6  Total Bilirubin 0.3 - 1.2 mg/dL 0.7  Alkaline Phos 38 - 126 U/L 168(H)  AST 15 - 41 U/L 22  ALT 0 - 44 U/L 21   Edinburgh Postnatal Depression Scale Screening Tool 09/20/2020  I have been able to laugh and see the funny side of things. 0  I have looked  forward with enjoyment to things. 0  I have blamed myself unnecessarily when things went wrong. 0  I have been anxious or worried for no good reason. 0  I have felt scared or panicky for no good reason. 0  Things have been getting on top of me. 0  I have been so unhappy that I have had difficulty sleeping. 0  I have felt sad or miserable. 0  I have been so unhappy that I have been crying. 0  The thought of harming myself has occurred to me. 0  Edinburgh Postnatal Depression Scale Total 0   Vaccines: TDaP UTD  Discharge instructions:  per After Visit Summary  After Visit Meds:  Allergies as of 09/21/2020       Reactions   Erythromycin Nausea Only        Medication List     STOP taking these medications    esomeprazole 20 MG capsule Commonly known  as: NEXIUM   famotidine-calcium carbonate-magnesium hydroxide 10-800-165 MG chewable tablet Commonly known as: PEPCID COMPLETE   levocetirizine 5 MG tablet Commonly known as: XYZAL   magnesium 30 MG tablet       TAKE these medications    acetaminophen 500 MG tablet Commonly known as: TYLENOL Take 2 tablets (1,000 mg total) by mouth every 6 (six) hours.   ibuprofen 600 MG tablet Commonly known as: ADVIL Take 1 tablet (600 mg total) by mouth every 6 (six) hours.   prenatal multivitamin Tabs tablet Take 1 tablet by mouth daily at 12 noon.       Diet: routine diet  Activity: Advance as tolerated. Pelvic rest for 6 weeks.   Newborn Data: Live born female  Birth Weight: 6 lb 15.3 oz (3155 g) APGAR: 8, 9  Newborn Delivery   Birth date/time: 09/20/2020 00:04:00 Delivery type: Vaginal, Vacuum (Extractor)      Named [name] Baby Feeding: Breast Disposition:home with mother  Delivery Report:  Review the Delivery Report for details.    Follow up:  Follow-up Information     Neta Mends, CNM. Schedule an appointment as soon as possible for a visit in 6 week(s).   Specialty: Obstetrics and Gynecology Contact information: [address] [place] [address] [phone]                June Leap, CNM, MSN 09/21/2020, 11:01 AM

## 2020-09-21 NOTE — Lactation Note (Signed)
This note was copied from a baby's chart. Lactation Consultation Note  Patient Name: Claire Wright [id number] Date: 09/21/2020 Reason for consult: Follow-up assessment;Term Age:38 hours  LC in to visit with P3 Mom of term baby on day of discharge.  Baby at 4% weight loss.    Mom concerned about baby's recessed chin and baby's ability to latch deeply.    Watched as Mom independently latched baby in football hold on right breast.  Mom using scissor hold.  Recommended she change to 4 fingers under and thumb above.  Baby latched and was pulling in with his cheeks.  Assessed latch and pulled down on baby's chin for a wider gape.  Untucked upper lips as well.  Mom reported a comfortable latch and FOB is aware of how to help flange lips.  Baby consistently sucking and swallowing.  Encouraged STS and cue based feedings.  Engorgement prevention and treatment reviewed. Mom aware of OP lactation support available to her and encouraged her to call prn.  LATCH Score Latch: Grasps breast easily, tongue down, lips flanged, rhythmical sucking.  Audible Swallowing: Spontaneous and intermittent  Type of Nipple: Everted at rest and after stimulation  Comfort (Breast/Nipple): Soft / non-tender  Hold (Positioning): Assistance needed to correctly position infant at breast and maintain latch.  LATCH Score: 9   Interventions Interventions: Breast feeding basics reviewed;Assisted with latch;Skin to skin;Breast massage;Pre-pump if needed;Adjust position;Support pillows;Expressed milk;Position options;Breast compression;Education;Hand pump  Discharge Discharge Education: Engorgement and breast care  Consult Status Consult Status: Complete Date: 09/21/20 Follow-up type: Call as needed    [name] 09/21/2020, 12:04 PM

## 2020-10-04 ENCOUNTER — Telehealth (HOSPITAL_COMMUNITY): Payer: Self-pay | Admitting: *Deleted

## 2020-10-04 NOTE — Telephone Encounter (Signed)
Hospital Discharge Follow-Up Call:  Patient reports that she is well and has no concerns about her healing process.  EPDS today was 1 and patient endorses that this accurately reflects that she is doing well emotionally.  Patient says that baby is well and she has no concern's about his health.  She reports that baby sleeps in a bassinet and takes one nap a day in another device.  Reviewed the ABCs of safe sleep.
